# Patient Record
Sex: Male | Born: 1985 | Race: White | Hispanic: Yes
Health system: Southern US, Community
[De-identification: ages and names within clinical notes are randomized; demographics above are authoritative.]

---

## 2016-06-13 ENCOUNTER — Emergency Department (HOSPITAL_COMMUNITY): Payer: No Typology Code available for payment source

## 2016-06-13 ENCOUNTER — Encounter (HOSPITAL_COMMUNITY): Payer: Self-pay

## 2016-06-13 ENCOUNTER — Emergency Department (HOSPITAL_COMMUNITY)
Admission: EM | Admit: 2016-06-13 | Discharge: 2016-06-14 | Disposition: A | Discharge status: Home / Self Care | Payer: No Typology Code available for payment source | Attending: Emergency Medicine | Admitting: Emergency Medicine

## 2016-06-13 DIAGNOSIS — S0083XA Contusion of other part of head, initial encounter: Secondary | ICD-10-CM

## 2016-06-13 DIAGNOSIS — Z5181 Encounter for therapeutic drug level monitoring: Secondary | ICD-10-CM | POA: Insufficient documentation

## 2016-06-13 DIAGNOSIS — Y9389 Activity, other specified: Secondary | ICD-10-CM | POA: Diagnosis not present

## 2016-06-13 DIAGNOSIS — Y92410 Unspecified street and highway as the place of occurrence of the external cause: Secondary | ICD-10-CM | POA: Diagnosis not present

## 2016-06-13 DIAGNOSIS — R931 Abnormal findings on diagnostic imaging of heart and coronary circulation: Secondary | ICD-10-CM | POA: Diagnosis not present

## 2016-06-13 DIAGNOSIS — S060X9A Concussion with loss of consciousness of unspecified duration, initial encounter: Secondary | ICD-10-CM | POA: Diagnosis not present

## 2016-06-13 DIAGNOSIS — Y999 Unspecified external cause status: Secondary | ICD-10-CM | POA: Diagnosis not present

## 2016-06-13 DIAGNOSIS — S0181XA Laceration without foreign body of other part of head, initial encounter: Secondary | ICD-10-CM | POA: Insufficient documentation

## 2016-06-13 DIAGNOSIS — S0990XA Unspecified injury of head, initial encounter: Secondary | ICD-10-CM | POA: Diagnosis present

## 2016-06-13 LAB — COMPREHENSIVE METABOLIC PANEL
ALT: 27 U/L (ref 17–63)
ANION GAP: 11 (ref 5–15)
AST: 30 U/L (ref 15–41)
Albumin: 4.2 g/dL (ref 3.5–5.0)
Alkaline Phosphatase: 71 U/L (ref 38–126)
BUN: 10 mg/dL (ref 6–20)
CALCIUM: 9 mg/dL (ref 8.9–10.3)
CHLORIDE: 105 mmol/L (ref 101–111)
CO2: 20 mmol/L — ABNORMAL LOW (ref 22–32)
CREATININE: 0.76 mg/dL (ref 0.61–1.24)
GLUCOSE: 108 mg/dL — AB (ref 65–99)
POTASSIUM: 3.2 mmol/L — AB (ref 3.5–5.1)
Sodium: 136 mmol/L (ref 135–145)
TOTAL PROTEIN: 7.1 g/dL (ref 6.5–8.1)
Total Bilirubin: 0.6 mg/dL (ref 0.3–1.2)

## 2016-06-13 LAB — I-STAT CHEM 8, ED
BUN: 10 mg/dL (ref 6–20)
CALCIUM ION: 1.07 mmol/L — AB (ref 1.15–1.40)
CREATININE: 0.9 mg/dL (ref 0.61–1.24)
Chloride: 105 mmol/L (ref 101–111)
GLUCOSE: 115 mg/dL — AB (ref 65–99)
HEMATOCRIT: 40 % (ref 39.0–52.0)
HEMOGLOBIN: 13.6 g/dL (ref 13.0–17.0)
Potassium: 3.2 mmol/L — ABNORMAL LOW (ref 3.5–5.1)
Sodium: 139 mmol/L (ref 135–145)
TCO2: 19 mmol/L (ref 0–100)

## 2016-06-13 LAB — CBC
HCT: 40.2 % (ref 39.0–52.0)
Hemoglobin: 13.9 g/dL (ref 13.0–17.0)
MCH: 29.3 pg (ref 26.0–34.0)
MCHC: 34.6 g/dL (ref 30.0–36.0)
MCV: 84.6 fL (ref 78.0–100.0)
PLATELETS: 158 10*3/uL (ref 150–400)
RBC: 4.75 MIL/uL (ref 4.22–5.81)
RDW: 13.5 % (ref 11.5–15.5)
WBC: 9.4 10*3/uL (ref 4.0–10.5)

## 2016-06-13 LAB — I-STAT CG4 LACTIC ACID, ED: LACTIC ACID, VENOUS: 2.38 mmol/L — AB (ref 0.5–1.9)

## 2016-06-13 LAB — ETHANOL: ALCOHOL ETHYL (B): 99 mg/dL — AB (ref <5)

## 2016-06-13 LAB — PROTIME-INR
INR: 1.02
Prothrombin Time: 13.4 seconds (ref 11.4–15.2)

## 2016-06-13 MED ORDER — TETANUS-DIPHTH-ACELL PERTUSSIS 5-2.5-18.5 LF-MCG/0.5 IM SUSP
0.5000 mL | Freq: Once | INTRAMUSCULAR | Status: AC
  Administered 2016-06-14: 0.5 mL via INTRAMUSCULAR
  Filled 2016-06-13: qty 0.5

## 2016-06-13 MED ORDER — IOPAMIDOL (ISOVUE-300) INJECTION 61%
INTRAVENOUS | Status: AC
  Administered 2016-06-13: 75 mL
  Filled 2016-06-13: qty 75

## 2016-06-13 MED ORDER — SODIUM CHLORIDE 0.9 % IV BOLUS (SEPSIS)
1000.0000 mL | Freq: Once | INTRAVENOUS | Status: AC
  Administered 2016-06-14: 1000 mL via INTRAVENOUS
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
## 2016-06-14 MED ORDER — LIDOCAINE HCL (PF) 1 % IJ SOLN
5.0000 mL | Freq: Once | INTRAMUSCULAR | Status: AC
  Administered 2016-06-14: 5 mL via INTRADERMAL
  Filled 2016-06-14: qty 5

## 2016-06-14 MED ORDER — ACETAMINOPHEN 500 MG PO TABS: 1000.0000 mg | ORAL_TABLET | Freq: Three times a day (TID) | ORAL | 0 refills | Status: AC

## 2016-06-14 MED ORDER — ACETAMINOPHEN 325 MG PO TABS
650.0000 mg | ORAL_TABLET | Freq: Once | ORAL | Status: AC
  Administered 2016-06-14: 650 mg via ORAL
  Filled 2016-06-14: qty 2

## 2016-06-14 MED ORDER — HYDROCODONE-ACETAMINOPHEN 5-325 MG PO TABS
1.0000 | ORAL_TABLET | Freq: Once | ORAL | Status: AC
  Administered 2016-06-14: 1 via ORAL
  Filled 2016-06-14: qty 1
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
## 2016-06-14 NOTE — ED Notes (Signed)
Give pt.paper scrubs to wear home

## 2016-06-15 NOTE — ED Provider Notes (Signed)
LACERATION REPAIR Performed by: Bethel BornKelly Marie Cyanna Neace Authorized by: Bethel BornKelly Marie Anwita Mencer Consent: Verbal consent obtained. Risks and benefits: risks, benefits and alternatives were discussed Consent given by: patient Patient identity confirmed: provided demographic data Prepped and Draped in normal sterile fashion Wound explored  Laceration Location: left upper eyebrow  Laceration Length: 2 cm  No Foreign Bodies seen or palpated  Anesthesia: local infiltration  Local anesthetic: lidocaine 1%   Anesthetic total: 5 ml  Irrigation method: syringe Amount of cleaning: standard  Skin closure: 6-0 Chromic gut  Number of sutures: 3  Technique: Simple interrupted  Patient tolerance: Patient tolerated the procedure well with no immediate complications.    Bethel BornKelly Marie Tarri Guilfoil, PA-C 06/15/16 0253    Nira ConnPedro Eduardo Cardama, MD 06/15/16 [phone]

## 2016-06-17 ENCOUNTER — Ambulatory Visit: Payer: Self-pay | Admitting: Diagnostic Neuroimaging

## 2016-06-18 ENCOUNTER — Encounter: Payer: Self-pay | Admitting: Diagnostic Neuroimaging
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
